# Patient Record
Sex: Male | Born: 1960
Health system: Southern US, Community
[De-identification: ages and names within clinical notes are randomized; demographics above are authoritative.]

## PROBLEM LIST (undated history)

## (undated) DIAGNOSIS — H409 Unspecified glaucoma: Secondary | ICD-10-CM

## (undated) DIAGNOSIS — I10 Essential (primary) hypertension: Secondary | ICD-10-CM

## (undated) DIAGNOSIS — Z860100 Personal history of colon polyps, unspecified: Secondary | ICD-10-CM

## (undated) DIAGNOSIS — L209 Atopic dermatitis, unspecified: Secondary | ICD-10-CM

## (undated) DIAGNOSIS — E7800 Pure hypercholesterolemia, unspecified: Secondary | ICD-10-CM

## (undated) DIAGNOSIS — F32A Depression, unspecified: Secondary | ICD-10-CM

## (undated) DIAGNOSIS — R7301 Impaired fasting glucose: Secondary | ICD-10-CM

## (undated) HISTORY — DX: Essential (primary) hypertension: I10

## (undated) HISTORY — DX: Atopic dermatitis, unspecified: L20.9

## (undated) HISTORY — DX: Personal history of colon polyps, unspecified: Z86.0100

## (undated) HISTORY — DX: Pure hypercholesterolemia, unspecified: E78.00

## (undated) HISTORY — DX: Depression, unspecified: F32.A

## (undated) HISTORY — DX: Impaired fasting glucose: R73.01

## (undated) HISTORY — DX: Unspecified glaucoma: H40.9

---
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
## 2006-05-02 ENCOUNTER — Inpatient Hospital Stay (HOSPITAL_COMMUNITY): Admission: EM | Admit: 2006-05-02 | Discharge: 2006-05-06 | Payer: Self-pay | Admitting: Emergency Medicine
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
## 2017-05-22 DIAGNOSIS — L309 Dermatitis, unspecified: Secondary | ICD-10-CM | POA: Diagnosis not present

## 2017-07-03 DIAGNOSIS — L309 Dermatitis, unspecified: Secondary | ICD-10-CM | POA: Diagnosis not present

## 2017-07-03 DIAGNOSIS — D2339 Other benign neoplasm of skin of other parts of face: Secondary | ICD-10-CM | POA: Diagnosis not present

## 2017-10-31 DIAGNOSIS — Z0000 Encounter for general adult medical examination without abnormal findings: Secondary | ICD-10-CM | POA: Diagnosis not present

## 2017-10-31 DIAGNOSIS — Z131 Encounter for screening for diabetes mellitus: Secondary | ICD-10-CM | POA: Diagnosis not present

## 2017-10-31 DIAGNOSIS — Z125 Encounter for screening for malignant neoplasm of prostate: Secondary | ICD-10-CM | POA: Diagnosis not present

## 2017-10-31 DIAGNOSIS — Z13220 Encounter for screening for lipoid disorders: Secondary | ICD-10-CM | POA: Diagnosis not present

## 2017-11-09 DIAGNOSIS — F4323 Adjustment disorder with mixed anxiety and depressed mood: Secondary | ICD-10-CM | POA: Diagnosis not present

## 2017-11-28 DIAGNOSIS — F4323 Adjustment disorder with mixed anxiety and depressed mood: Secondary | ICD-10-CM | POA: Diagnosis not present

## 2017-12-07 DIAGNOSIS — F4323 Adjustment disorder with mixed anxiety and depressed mood: Secondary | ICD-10-CM | POA: Diagnosis not present

## 2017-12-17 DIAGNOSIS — F4323 Adjustment disorder with mixed anxiety and depressed mood: Secondary | ICD-10-CM | POA: Diagnosis not present

## 2018-01-14 DIAGNOSIS — F4323 Adjustment disorder with mixed anxiety and depressed mood: Secondary | ICD-10-CM | POA: Diagnosis not present

## 2018-05-20 DIAGNOSIS — F4321 Adjustment disorder with depressed mood: Secondary | ICD-10-CM | POA: Diagnosis not present

## 2018-05-31 DIAGNOSIS — F4321 Adjustment disorder with depressed mood: Secondary | ICD-10-CM | POA: Diagnosis not present

## 2019-01-21 DIAGNOSIS — F411 Generalized anxiety disorder: Secondary | ICD-10-CM | POA: Diagnosis not present

## 2019-01-28 DIAGNOSIS — F411 Generalized anxiety disorder: Secondary | ICD-10-CM | POA: Diagnosis not present

## 2019-02-04 DIAGNOSIS — F411 Generalized anxiety disorder: Secondary | ICD-10-CM | POA: Diagnosis not present

## 2019-02-11 DIAGNOSIS — F411 Generalized anxiety disorder: Secondary | ICD-10-CM | POA: Diagnosis not present

## 2019-03-11 DIAGNOSIS — F411 Generalized anxiety disorder: Secondary | ICD-10-CM | POA: Diagnosis not present

## 2019-03-18 DIAGNOSIS — F411 Generalized anxiety disorder: Secondary | ICD-10-CM | POA: Diagnosis not present

## 2019-03-21 DIAGNOSIS — F17200 Nicotine dependence, unspecified, uncomplicated: Secondary | ICD-10-CM | POA: Diagnosis not present

## 2019-03-21 DIAGNOSIS — Z1211 Encounter for screening for malignant neoplasm of colon: Secondary | ICD-10-CM | POA: Diagnosis not present

## 2019-04-01 DIAGNOSIS — F411 Generalized anxiety disorder: Secondary | ICD-10-CM | POA: Diagnosis not present

## 2019-04-08 DIAGNOSIS — F411 Generalized anxiety disorder: Secondary | ICD-10-CM | POA: Diagnosis not present

## 2019-04-15 DIAGNOSIS — F411 Generalized anxiety disorder: Secondary | ICD-10-CM | POA: Diagnosis not present

## 2019-04-21 DIAGNOSIS — Z125 Encounter for screening for malignant neoplasm of prostate: Secondary | ICD-10-CM | POA: Diagnosis not present

## 2019-04-21 DIAGNOSIS — Z13220 Encounter for screening for lipoid disorders: Secondary | ICD-10-CM | POA: Diagnosis not present

## 2019-04-21 DIAGNOSIS — Z0000 Encounter for general adult medical examination without abnormal findings: Secondary | ICD-10-CM | POA: Diagnosis not present

## 2019-04-22 DIAGNOSIS — F411 Generalized anxiety disorder: Secondary | ICD-10-CM | POA: Diagnosis not present

## 2019-04-29 DIAGNOSIS — F411 Generalized anxiety disorder: Secondary | ICD-10-CM | POA: Diagnosis not present

## 2019-05-06 DIAGNOSIS — Z01818 Encounter for other preprocedural examination: Secondary | ICD-10-CM | POA: Diagnosis not present

## 2019-05-13 DIAGNOSIS — F411 Generalized anxiety disorder: Secondary | ICD-10-CM | POA: Diagnosis not present

## 2019-05-20 DIAGNOSIS — F411 Generalized anxiety disorder: Secondary | ICD-10-CM | POA: Diagnosis not present

## 2019-05-27 DIAGNOSIS — F411 Generalized anxiety disorder: Secondary | ICD-10-CM | POA: Diagnosis not present

## 2019-06-10 DIAGNOSIS — F411 Generalized anxiety disorder: Secondary | ICD-10-CM | POA: Diagnosis not present

## 2019-06-11 DIAGNOSIS — Z1159 Encounter for screening for other viral diseases: Secondary | ICD-10-CM | POA: Diagnosis not present

## 2019-06-16 DIAGNOSIS — D124 Benign neoplasm of descending colon: Secondary | ICD-10-CM | POA: Diagnosis not present

## 2019-06-16 DIAGNOSIS — D122 Benign neoplasm of ascending colon: Secondary | ICD-10-CM | POA: Diagnosis not present

## 2019-06-16 DIAGNOSIS — Z1211 Encounter for screening for malignant neoplasm of colon: Secondary | ICD-10-CM | POA: Diagnosis not present

## 2019-06-16 DIAGNOSIS — K648 Other hemorrhoids: Secondary | ICD-10-CM | POA: Diagnosis not present

## 2019-06-16 DIAGNOSIS — K635 Polyp of colon: Secondary | ICD-10-CM | POA: Diagnosis not present

## 2019-06-24 DIAGNOSIS — F411 Generalized anxiety disorder: Secondary | ICD-10-CM | POA: Diagnosis not present

## 2019-07-08 DIAGNOSIS — F411 Generalized anxiety disorder: Secondary | ICD-10-CM | POA: Diagnosis not present

## 2019-07-15 ENCOUNTER — Other Ambulatory Visit: Payer: Self-pay

## 2019-07-15 DIAGNOSIS — Z20822 Contact with and (suspected) exposure to covid-19: Secondary | ICD-10-CM

## 2019-07-17 LAB — NOVEL CORONAVIRUS, NAA: SARS-CoV-2, NAA: DETECTED — AB

## 2019-07-26 ENCOUNTER — Other Ambulatory Visit: Payer: Self-pay

## 2019-07-26 ENCOUNTER — Encounter (HOSPITAL_COMMUNITY): Payer: Self-pay | Admitting: Emergency Medicine

## 2019-07-26 ENCOUNTER — Emergency Department (HOSPITAL_COMMUNITY)
Admission: EM | Admit: 2019-07-26 | Discharge: 2019-07-26 | Disposition: A | Discharge status: Home / Self Care | Payer: BC Managed Care – PPO | Attending: Emergency Medicine | Admitting: Emergency Medicine

## 2019-07-26 ENCOUNTER — Emergency Department (HOSPITAL_COMMUNITY): Payer: BC Managed Care – PPO

## 2019-07-26 DIAGNOSIS — R059 Cough, unspecified: Secondary | ICD-10-CM

## 2019-07-26 DIAGNOSIS — U071 COVID-19: Secondary | ICD-10-CM | POA: Insufficient documentation

## 2019-07-26 DIAGNOSIS — R05 Cough: Secondary | ICD-10-CM

## 2019-07-26 DIAGNOSIS — R509 Fever, unspecified: Secondary | ICD-10-CM | POA: Insufficient documentation

## 2019-07-26 DIAGNOSIS — F17228 Nicotine dependence, chewing tobacco, with other nicotine-induced disorders: Secondary | ICD-10-CM | POA: Insufficient documentation

## 2019-07-26 DIAGNOSIS — R0602 Shortness of breath: Secondary | ICD-10-CM | POA: Diagnosis not present

## 2019-07-26 LAB — CBC WITH DIFFERENTIAL/PLATELET
Abs Immature Granulocytes: 0.02 10*3/uL (ref 0.00–0.07)
Basophils Absolute: 0 10*3/uL (ref 0.0–0.1)
Basophils Relative: 0 %
Eosinophils Absolute: 0 10*3/uL (ref 0.0–0.5)
Eosinophils Relative: 0 %
HCT: 42.7 % (ref 39.0–52.0)
Hemoglobin: 14.2 g/dL (ref 13.0–17.0)
Immature Granulocytes: 0 %
Lymphocytes Relative: 9 %
Lymphs Abs: 0.7 10*3/uL (ref 0.7–4.0)
MCH: 30.3 pg (ref 26.0–34.0)
MCHC: 33.3 g/dL (ref 30.0–36.0)
MCV: 91 fL (ref 80.0–100.0)
Monocytes Absolute: 0.9 10*3/uL (ref 0.1–1.0)
Monocytes Relative: 10 %
Neutro Abs: 6.6 10*3/uL (ref 1.7–7.7)
Neutrophils Relative %: 81 %
Platelets: 274 10*3/uL (ref 150–400)
RBC: 4.69 MIL/uL (ref 4.22–5.81)
RDW: 11.9 % (ref 11.5–15.5)
WBC: 8.2 10*3/uL (ref 4.0–10.5)
nRBC: 0 % (ref 0.0–0.2)

## 2019-07-26 LAB — COMPREHENSIVE METABOLIC PANEL
ALT: 42 U/L (ref 0–44)
AST: 53 U/L — ABNORMAL HIGH (ref 15–41)
Albumin: 3.5 g/dL (ref 3.5–5.0)
Alkaline Phosphatase: 52 U/L (ref 38–126)
Anion gap: 14 (ref 5–15)
BUN: 33 mg/dL — ABNORMAL HIGH (ref 6–20)
CO2: 20 mmol/L — ABNORMAL LOW (ref 22–32)
Calcium: 8.5 mg/dL — ABNORMAL LOW (ref 8.9–10.3)
Chloride: 103 mmol/L (ref 98–111)
Creatinine, Ser: 1.53 mg/dL — ABNORMAL HIGH (ref 0.61–1.24)
GFR calc Af Amer: 56 mL/min — ABNORMAL LOW (ref >60)
GFR calc non Af Amer: 49 mL/min — ABNORMAL LOW (ref >60)
Glucose, Bld: 134 mg/dL — ABNORMAL HIGH (ref 70–99)
Potassium: 3.6 mmol/L (ref 3.5–5.1)
Sodium: 137 mmol/L (ref 135–145)
Total Bilirubin: 1.2 mg/dL (ref 0.3–1.2)
Total Protein: 7.9 g/dL (ref 6.5–8.1)

## 2019-07-26 LAB — FIBRINOGEN: Fibrinogen: >800 mg/dL — ABNORMAL HIGH (ref 210–475)

## 2019-07-26 LAB — POC SARS CORONAVIRUS 2 AG -  ED: SARS Coronavirus 2 Ag: NEGATIVE

## 2019-07-26 LAB — C-REACTIVE PROTEIN: CRP: 18.2 mg/dL — ABNORMAL HIGH (ref <1.0)

## 2019-07-26 LAB — LACTATE DEHYDROGENASE: LDH: 466 U/L — ABNORMAL HIGH (ref 98–192)

## 2019-07-26 LAB — LACTIC ACID, PLASMA
Lactic Acid, Venous: 1.7 mmol/L (ref 0.5–1.9)
Lactic Acid, Venous: 1.3 mmol/L (ref 0.5–1.9)

## 2019-07-26 LAB — FERRITIN: Ferritin: 1261 ng/mL — ABNORMAL HIGH (ref 24–336)

## 2019-07-26 LAB — PROCALCITONIN: Procalcitonin: 0.33 ng/mL

## 2019-07-26 LAB — D-DIMER, QUANTITATIVE: D-Dimer, Quant: 2.11 ug/mL-FEU — ABNORMAL HIGH (ref 0.00–0.50)

## 2019-07-26 LAB — TRIGLYCERIDES: Triglycerides: 96 mg/dL (ref <150)

## 2019-07-26 IMAGING — DX DG CHEST 1V PORT
1 series · 1 of 1 positions shown · non-contrast
Comparison: [DATE]

CLINICAL DATA: Shortness of breath, cough

EXAM:
PORTABLE CHEST 1 VIEW

[chest ap]
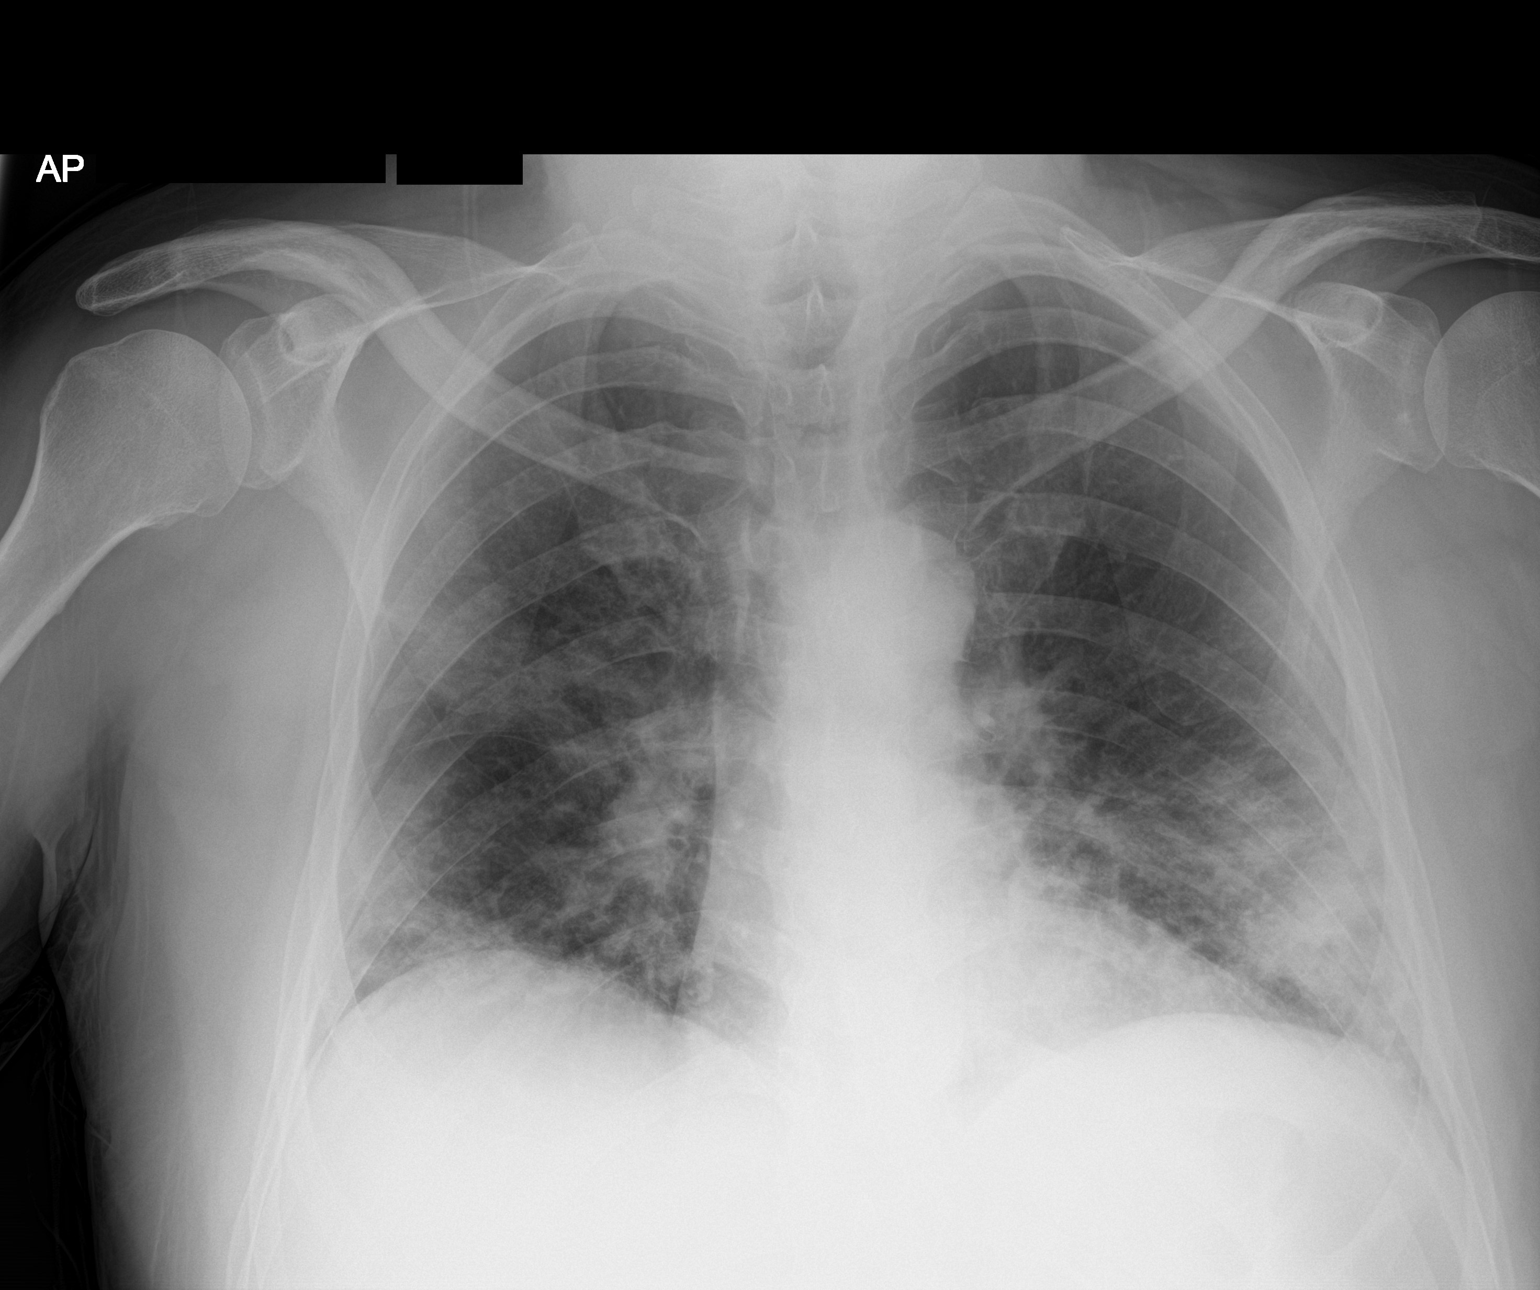

[1 of 1 positions shown; findings below may reference images not displayed]

FINDINGS: Patchy bilateral airspace opacities noted, predominantly
peripherally. Heart is normal size. No effusions. No acute bony
abnormality.
IMPRESSION: Patchy bilateral airspace disease most compatible with multifocal
pneumonia. Appearance would be compatible with COVID pneumonia.

## 2019-07-26 MED ORDER — BENZONATATE 100 MG PO CAPS: 100.0000 mg | ORAL_CAPSULE | Freq: Three times a day (TID) | ORAL | 0 refills | Status: DC

## 2019-07-26 MED ORDER — ACETAMINOPHEN 500 MG PO TABS
1000.0000 mg | ORAL_TABLET | Freq: Once | ORAL | Status: AC
  Administered 2019-07-26: 1000 mg via ORAL
  Filled 2019-07-26: qty 2

## 2019-07-26 NOTE — ED Notes (Signed)
Pt was verbalized discharge instructions. Pt had no further questions at this time. NAD. 

## 2019-07-26 NOTE — Discharge Instructions (Signed)
I am sending you home with some cough medication. Take as prescribed. Continue to take Tylenol and ibuprofen as needed for fevers. Follow-up with your PCP if your symptoms do not improve within the next week. Continue to self-quarantine away from others until you are symptom free. Return to the ER for new or worsening symptoms.

## 2019-07-26 NOTE — ED Triage Notes (Signed)
Patient reports he has had Covid 19 for 2 weeks and symptoms have worsened. He describes having a fever night sweats, coughing and low PO intake. Patient is very concerned about his condition with his age.

## 2019-07-26 NOTE — ED Provider Notes (Signed)
[facility]-EMERGENCY DEPT Provider Note   CSN: [id number] Arrival date & time: 07/26/19  1344     History   Chief Complaint Chief Complaint  Patient presents with  . Fever  . Cough  . Night Sweats    HPI Max Walsh is a 58 y.o. male with no significant past medical history who presents to the ED due to gradual onset of worsening productive cough and shortness of breath. Patient tested positive for COVID-19 on 11/17. Patient notes his symptoms started a few days prior. He notes he has had an intermittent fever since diagnosis. T-max 102.8. He has tried tylenol, Nyquil, and Dayquil with moderate relief. Patient notes shortness of breath is only with exertion. Denies history of asthma. Cough is associated with yellow sputum. Patient denies chest pain, abdominal pain, nausea, vomiting, and diarrhea.   History reviewed. No pertinent past medical history.  There are no active problems to display for this patient.   History reviewed. No pertinent surgical history.      Home Medications    Prior to Admission medications   Medication Sig Start Date End Date Taking? Authorizing Provider  acetaminophen (TYLENOL) 325 MG tablet Take 650 mg by mouth every 6 (six) hours as needed for fever.   Yes [provider]  benzonatate (TESSALON) 100 MG capsule Take 1 capsule (100 mg total) by mouth every 8 (eight) hours. 07/26/19   Cheek, Vesta Mixer, PA-C  buPROPion (WELLBUTRIN XL) 150 MG 24 hr tablet Take 150 mg by mouth every morning. 06/16/19   [provider]    Family History History reviewed. No pertinent family history.  Social History Social History   Tobacco Use  . Smoking status: Never Smoker  . Smokeless tobacco: Current User    Types: Snuff  Substance Use Topics  . Alcohol use: Not on file  . Drug use: Not on file     Allergies   Patient has no known allergies.   Review of Systems Review of Systems  Constitutional: Positive  for appetite change, chills, diaphoresis and fever.  Respiratory: Positive for cough and shortness of breath.   Cardiovascular: Negative for chest pain and leg swelling.  Gastrointestinal: Negative for abdominal pain, diarrhea, nausea and vomiting.  Musculoskeletal: Negative for neck stiffness.     Physical Exam Updated Vital Signs BP 138/80   Pulse (!) 116   Temp (!) 102.4 F (39.1 C) (Oral)   Resp (!) 30   Ht 6' (1.829 m)   Wt 79.8 kg   SpO2 91%   BMI 23.87 kg/m   Physical Exam Vitals signs and nursing note reviewed.  Constitutional:      General: He is not in acute distress. HENT:     Head: Normocephalic.     Nose: Nose normal.     Mouth/Throat:     Mouth: Mucous membranes are moist.     Pharynx: No oropharyngeal exudate or posterior oropharyngeal erythema.  Eyes:     Conjunctiva/sclera: Conjunctivae normal.  Neck:     Musculoskeletal: Neck supple.     Comments: No meningismus Cardiovascular:     Rate and Rhythm: Regular rhythm. Tachycardia present.     Pulses: Normal pulses.     Heart sounds: Normal heart sounds. No murmur. No friction rub. No gallop.   Pulmonary:     Effort: Pulmonary effort is normal.     Breath sounds: Normal breath sounds.     Comments: Coughing on exam. Respirations equal and unlabored, patient able to  speak in full sentences, lungs clear to auscultation bilaterally. No accessory muscle usage.  Abdominal:     General: Abdomen is flat. There is no distension.     Palpations: Abdomen is soft.     Tenderness: There is no abdominal tenderness. There is no guarding or rebound.  Musculoskeletal:     Right lower leg: No edema.     Left lower leg: No edema.     Comments: Able to move all 4 extremities without difficulty. Distal sensation and pulses intact bilaterally. No lower extremity edema. Negative Homans sign bilaterally.   Skin:    General: Skin is warm and dry.  Neurological:     General: No focal deficit present.     Mental Status: He  is alert.      ED Treatments / Results  Labs (all labs ordered are listed, but only abnormal results are displayed) Labs Reviewed  COMPREHENSIVE METABOLIC PANEL - Abnormal; Notable for the following components:      Result Value   CO2 20 (*)    Glucose, Bld 134 (*)    BUN 33 (*)    Creatinine, Ser 1.53 (*)    Calcium 8.5 (*)    AST 53 (*)    GFR calc non Af Amer 49 (*)    GFR calc Af Amer 56 (*)    All other components within normal limits  D-DIMER, QUANTITATIVE (NOT AT [facility]) - Abnormal; Notable for the following components:   D-Dimer, Quant 2.11 (*)    All other components within normal limits  LACTATE DEHYDROGENASE - Abnormal; Notable for the following components:   LDH 466 (*)    All other components within normal limits  FERRITIN - Abnormal; Notable for the following components:   Ferritin 1,261 (*)    All other components within normal limits  FIBRINOGEN - Abnormal; Notable for the following components:   Fibrinogen >800 (*)    All other components within normal limits  C-REACTIVE PROTEIN - Abnormal; Notable for the following components:   CRP 18.2 (*)    All other components within normal limits  CULTURE, BLOOD (ROUTINE X 2)  CULTURE, BLOOD (ROUTINE X 2)  LACTIC ACID, PLASMA  LACTIC ACID, PLASMA  CBC WITH DIFFERENTIAL/PLATELET  PROCALCITONIN  TRIGLYCERIDES  POC SARS CORONAVIRUS 2 AG -  ED    EKG EKG Interpretation  Date/Time:  Saturday July 26 2019 19:34:32 EST Ventricular Rate:  104 PR Interval:    QRS Duration: 92 QT Interval:  309 QTC Calculation: 407 R Axis:   62 Text Interpretation: Sinus tachycardia Probable left atrial enlargement Confirmed by Loren RacerYelverton, David ([id number]) on 07/26/2019 9:41:15 PM   Radiology Dg Chest Port 1 View  Result Date: 07/26/2019 CLINICAL DATA:  Shortness of breath, cough EXAM: PORTABLE CHEST 1 VIEW COMPARISON:  05/01/2006 FINDINGS: Patchy bilateral airspace opacities noted, predominantly peripherally. Heart is normal  size. No effusions. No acute bony abnormality. IMPRESSION: Patchy bilateral airspace disease most compatible with multifocal pneumonia. Appearance would be compatible with COVID pneumonia. Electronically Signed   By: Charlett NoseKevin  Dover M.D.   On: 07/26/2019 19:04    Procedures Procedures (including critical care time)  Medications Ordered in ED Medications  acetaminophen (TYLENOL) tablet 1,000 mg (1,000 mg Oral Given 07/26/19 1919)     Initial Impression / Assessment and Plan / ED Course  I have reviewed the triage vital signs and the nursing notes.  Pertinent labs & imaging results that were available during my care of the patient were reviewed by  me and considered in my medical decision making (see chart for details).       58 year old male presents to the ED due to worsening COVID-19 symptoms. Patient tested positive for COVID on 11/19. Upon arrival, patient is tachycardic at 116 and febrile at 102.4. O2 saturations on ambulation maintained at 91%; however, while speaking to patient O2 saturations dropped down to 87%. Given patient's hypoxia, will order [place] admission labs.   CXR personally reviewed which demonstrates bilateral patchy airspace disease typical of COVID multifocal pneumonia. CBC unremarkable with no leukocytosis. CMP positive for AKI with creatinine at 1.53 and BUN at 33. Elevated AST at 53. Lactic acid normal at 1.3. Inflammatory markers are all elevated as expect with COVID-19 infection. EKG personally reviewed which demonstrated sinus tachycardia with no signs of ischemia.  9:40 PM re-assessed patient. Patient is resting comfortably. He notes his symptoms have drastically improved with Tylenol. Patient ambulated and maintained O2 saturation above 95%. Tachycardia has resolved. Patient mildly tachypneic, but speaking in full sentences and without accessory muscle usage. Shared decision making with patient. Patient feels comfortable going home and being treated symptomatically.  Will discharge patient with Tessalon Perles for cough. Advised patient to continue to drinking lots of fluids. I have instructed patient to follow-up with PCP if symptoms do not improve within the next week. Strict ED precautions discussed with patient. Patient states understanding and agrees to plan. Patient discharged home in no acute distress and stable vitals   Discussed patient with Dr. Lita Mains who agrees with assessment and plan.   Benji Poynter was evaluated in Emergency Department on 07/27/2019 for the symptoms described in the history of present illness. He was evaluated in the context of the global COVID-19 pandemic, which necessitated consideration that the patient might be at risk for infection with the SARS-CoV-2 virus that causes COVID-19. Institutional protocols and algorithms that pertain to the evaluation of patients at risk for COVID-19 are in a state of rapid change based on information released by regulatory bodies including the CDC and federal and state organizations. These policies and algorithms were followed during the patient's care in the ED.  Final Clinical Impressions(s) / ED Diagnoses   Final diagnoses:  COVID-19  Cough    ED Discharge Orders         Ordered    benzonatate (TESSALON) 100 MG capsule  Every 8 hours     07/26/19 2142           Jonette Eva, PA-C 07/27/19 0001    Julianne Rice, MD 08/09/19 1246

## 2019-07-26 NOTE — ED Notes (Signed)
Patients SPO2 remained 95% RA while ambulating in room. Will continue to monitor.

## 2019-07-28 ENCOUNTER — Telehealth (HOSPITAL_BASED_OUTPATIENT_CLINIC_OR_DEPARTMENT_OTHER): Payer: Self-pay | Admitting: *Deleted

## 2019-07-29 LAB — CULTURE, BLOOD (ROUTINE X 2): Special Requests: ADEQUATE

## 2019-07-30 ENCOUNTER — Telehealth: Payer: Self-pay

## 2019-07-30 NOTE — Progress Notes (Signed)
ED Antimicrobial Stewardship Positive Culture Follow Up   Max Walsh is an 58 y.o. male recently dx with COVID presented to [facility] on 07/26/2019 with a chief complaint of SOB, fever and cough.  Chief Complaint  Patient presents with  . Fever  . Cough  . Night Sweats    Recent Results (from the past 720 hour(s))  Novel Coronavirus, NAA ([facility])     Status: Abnormal   Collection Time: 07/15/19 12:00 AM   Specimen: Nasopharyngeal(NP) swabs in vial transport medium   NASOPHARYNGE  TESTING  Result Value Ref Range Status   SARS-CoV-2, NAA Detected (A) Not Detected Final    Comment: This nucleic acid amplification test was developed and its performance characteristics determined by [employer]. Nucleic acid amplification tests include PCR and TMA. This test has not been FDA cleared or approved. This test has been authorized by FDA under an Emergency Use Authorization (EUA). This test is only authorized for the duration of time the declaration that circumstances exist justifying the authorization of the emergency use of in vitro diagnostic tests for detection of SARS-CoV-2 virus and/or diagnosis of COVID-19 infection under section 564(b)(1) of the Act, 21 U.S.C. 762GBT-5(V) (1), unless the authorization is terminated or revoked sooner. When diagnostic testing is negative, the possibility of a false negative result should be considered in the context of a patient's recent exposures and the presence of clinical signs and symptoms consistent with COVID-19. An individual without symptoms of COVID-19 and who is not shedding SARS-CoV-2 virus would  expect to have a negative (not detected) result in this assay.   Blood Culture (routine x 2)     Status: Abnormal   Collection Time: 07/26/19  8:20 PM   Specimen: BLOOD LEFT HAND  Result Value Ref Range Status   Specimen Description   Final    BLOOD LEFT HAND Performed at [facility] [address] [place], [place] [address]    Special Requests   Final    BOTTLES DRAWN AEROBIC AND ANAEROBIC Blood Culture adequate volume Performed at [facility] [address], [place], [place] [address]    Culture  Setup Time   Final    GRAM POSITIVE COCCI ANAEROBIC BOTTLE ONLY CRITICAL RESULT CALLED TO, READ BACK BY AND VERIFIED WITH: CRN S MAYNERD @0028  07/28/19 BY S GEZAHEGN     Culture (A)  Final    STAPHYLOCOCCUS SPECIES (COAGULASE NEGATIVE) THE SIGNIFICANCE OF ISOLATING THIS ORGANISM FROM A SINGLE SET OF BLOOD CULTURES WHEN MULTIPLE SETS ARE DRAWN IS UNCERTAIN. PLEASE NOTIFY THE MICROBIOLOGY DEPARTMENT WITHIN ONE WEEK IF SPECIATION AND SENSITIVITIES ARE REQUIRED. Performed at [facility], [place] [address] [place], [place] [address]    Report Status 07/29/2019 FINAL  Final  Blood Culture (routine x 2)     Status: None (Preliminary result)   Collection Time: 07/26/19  8:20 PM   Specimen: BLOOD  Result Value Ref Range Status   Specimen Description   Final    BLOOD RIGHT ANTECUBITAL Performed at [facility] [address], [place], [place] [address]    Special Requests   Final    BOTTLES DRAWN AEROBIC ONLY Blood Culture adequate volume Performed at [facility] [address] [place], [place] [address]    Culture   Final    NO GROWTH 3 DAYS Performed at [facility], [place] [address] [place], [place] [address]    Report Status PENDING  Incomplete    -  Suspects CoNS in 1 set of bcx is contaminant - no txment is needed at this time  ED Provider: Ebbie Ridge, PA-C   Jagger Demonte P 07/30/2019, 1:20 PM Clinical Pharmacist [phone]

## 2019-07-30 NOTE — Telephone Encounter (Signed)
No treatment for [facility] ED 07/26/2019 per Irena Cords [facility]

## 2019-08-01 LAB — CULTURE, BLOOD (ROUTINE X 2)
Culture: NO GROWTH
Special Requests: ADEQUATE

## 2019-08-04 DIAGNOSIS — U071 COVID-19: Secondary | ICD-10-CM | POA: Diagnosis not present

## 2019-12-04 ENCOUNTER — Ambulatory Visit: Payer: Self-pay | Attending: Internal Medicine

## 2019-12-04 DIAGNOSIS — Z23 Encounter for immunization: Secondary | ICD-10-CM

## 2019-12-04 NOTE — Progress Notes (Signed)
   Covid-19 Vaccination Clinic  Name:  Max Walsh    MRN: [id number] DOB: 01-21-1961  12/04/2019  Max Walsh was observed post Covid-19 immunization for 15 minutes without incident. He was provided with Vaccine Information Sheet and instruction to access the V-Safe system.   Max Walsh was instructed to call 911 with any severe reactions post vaccine: Marland Kitchen Difficulty breathing  . Swelling of face and throat  . A fast heartbeat  . A bad rash all over body  . Dizziness and weakness   Immunizations Administered    Name Date Dose VIS Date Route   Pfizer COVID-19 Vaccine 12/04/2019  3:54 PM 0.3 mL 08/08/2019 Intramuscular   Manufacturer: [employer], [employer]   Lot: [id number]   NDC: [address]-3

## 2019-12-29 ENCOUNTER — Ambulatory Visit: Payer: Self-pay | Attending: Internal Medicine

## 2019-12-29 DIAGNOSIS — Z23 Encounter for immunization: Secondary | ICD-10-CM

## 2019-12-29 NOTE — Progress Notes (Signed)
   Covid-19 Vaccination Clinic  Name:  Max Walsh    MRN: [id number] DOB: Jan 04, 1961  12/29/2019  Max Walsh was observed post Covid-19 immunization for 15 minutes without incident. He was provided with Vaccine Information Sheet and instruction to access the V-Safe system.   Max Walsh was instructed to call 911 with any severe reactions post vaccine: Marland Kitchen Difficulty breathing  . Swelling of face and throat  . A fast heartbeat  . A bad rash all over body  . Dizziness and weakness   Immunizations Administered    Name Date Dose VIS Date Route   Pfizer COVID-19 Vaccine 12/29/2019  9:20 AM 0.3 mL 10/22/2018 Intramuscular   Manufacturer: [employer], [employer]   Lot: [id number]   NDC: [address]-9

## 2021-10-27 ENCOUNTER — Other Ambulatory Visit (HOSPITAL_COMMUNITY): Payer: Self-pay | Admitting: Optometry

## 2021-10-27 ENCOUNTER — Other Ambulatory Visit: Payer: Self-pay | Admitting: Optometry

## 2021-10-27 DIAGNOSIS — H40013 Open angle with borderline findings, low risk, bilateral: Secondary | ICD-10-CM

## 2021-11-14 ENCOUNTER — Other Ambulatory Visit: Payer: Self-pay

## 2021-11-14 ENCOUNTER — Encounter (HOSPITAL_COMMUNITY): Payer: Self-pay

## 2021-11-14 ENCOUNTER — Ambulatory Visit (HOSPITAL_COMMUNITY)
Admission: RE | Admit: 2021-11-14 | Discharge: 2021-11-14 | Disposition: A | Discharge status: Home / Self Care | Payer: 59 | Source: Ambulatory Visit | Attending: Optometry | Admitting: Optometry

## 2021-11-14 DIAGNOSIS — H40013 Open angle with borderline findings, low risk, bilateral: Secondary | ICD-10-CM

## 2021-11-14 IMAGING — MR MR ORBITS WO/W CM
4 of 6 series · 19 of 48 positions shown · IV contrast (gadavist)
Comparison: None.

CLINICAL DATA: Open-angle glaucoma

EXAM:
MRI HEAD AND ORBITS WITHOUT AND WITH CONTRAST
TECHNIQUE: Multiplanar, multiecho pulse sequences of the brain and surrounding
structures were obtained without and with intravenous contrast.
Multiplanar, multiecho pulse sequences of the orbits and surrounding
structures were obtained including fat saturation techniques, before
and after intravenous contrast administration.
CONTRAST:  8mL GADAVIST GADOBUTROL 1 MMOL/ML IV SOLN

[Series 8: T1 · coronal · 3.0mm · 0.35mm/px · 3 of 36 slices shown (1 of 2)]
[im 4/36]
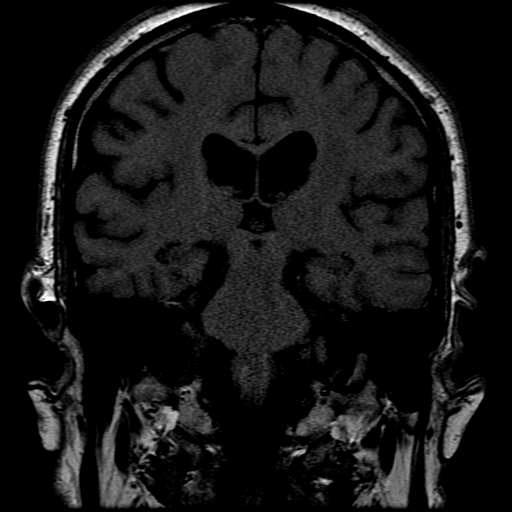
[im 20/36]
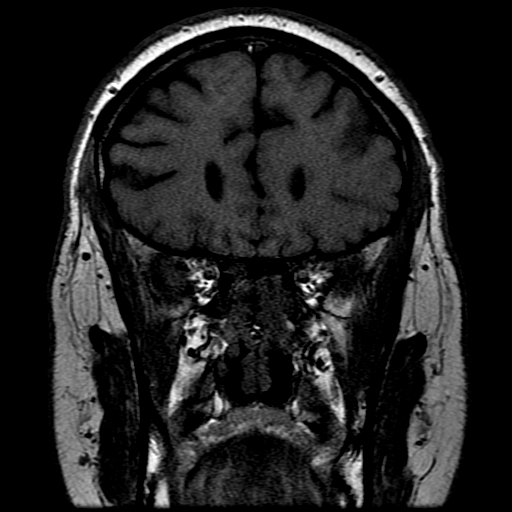
[im 32/36]
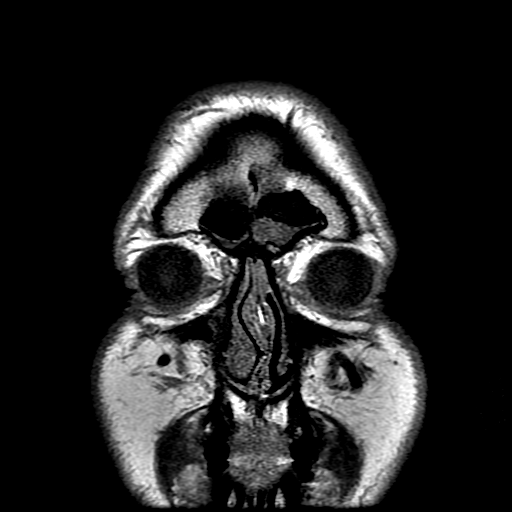

[Series 11: T2 fat-sat · coronal · 3.0mm · 0.35mm/px · 8 of 36 slices shown (1 of 2)]
[im 1/36]
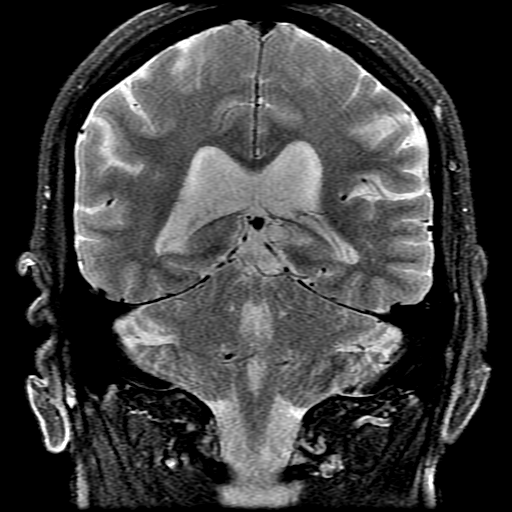
[im 4/36]
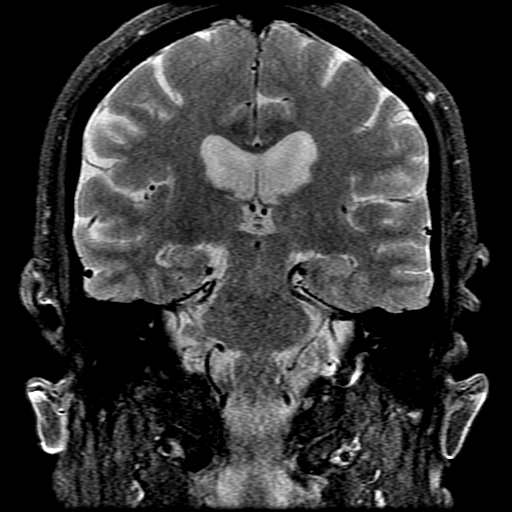
[im 12/36]
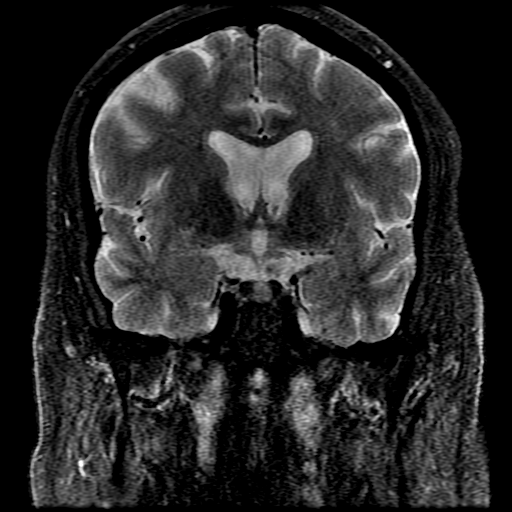
[im 16/36]
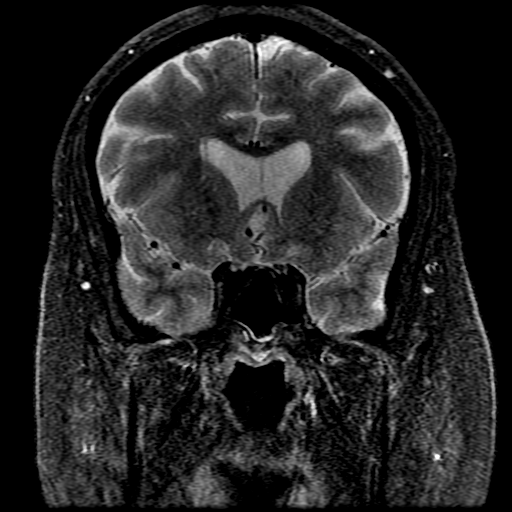
[im 20/36]
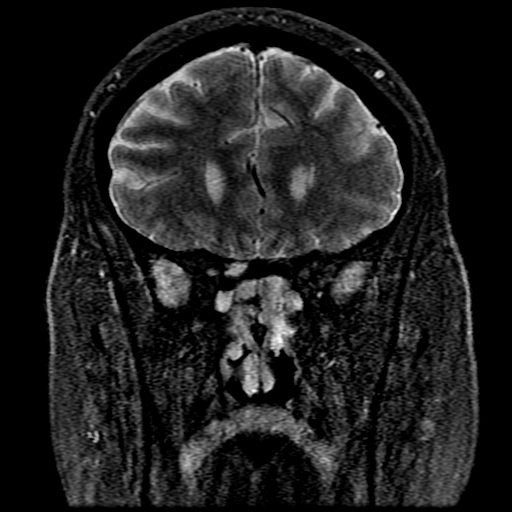
[im 24/36]
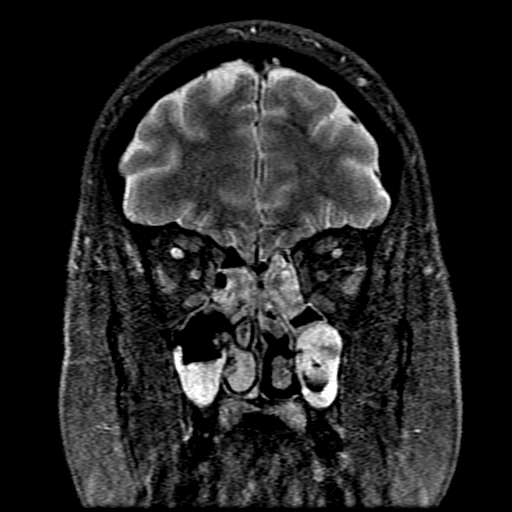
[im 32/36]
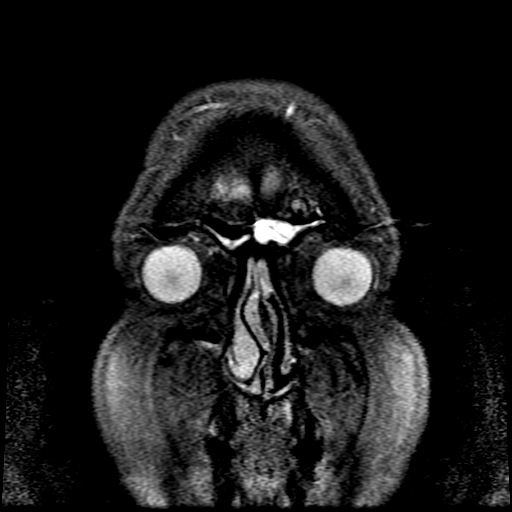
[im 36/36]
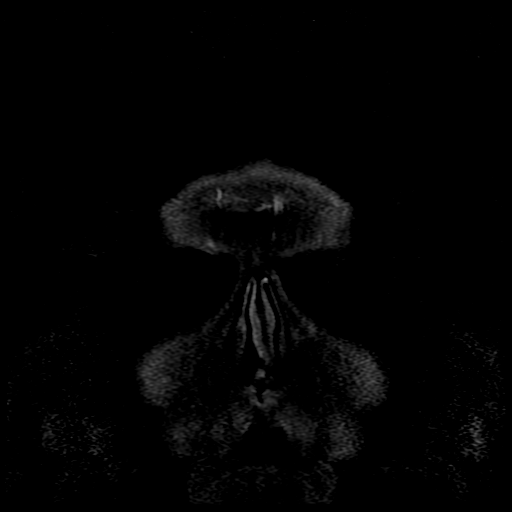

[Series 12: T1 · axial · 3.0mm · 0.35mm/px · z∈[+3,+55]mm · 3 of 20 slices shown (2 of 2)]
[im 4/20]
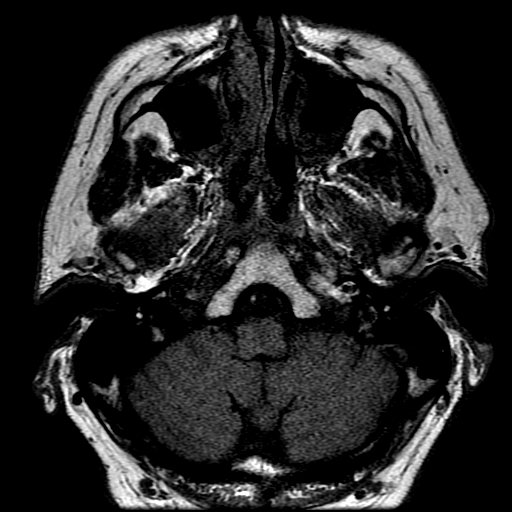
[im 12/20]
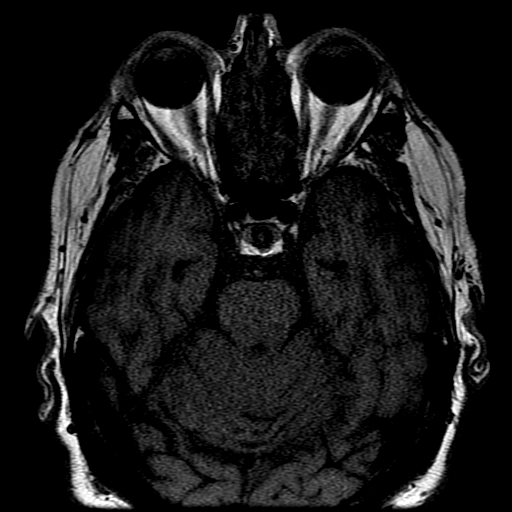
[im 20/20]
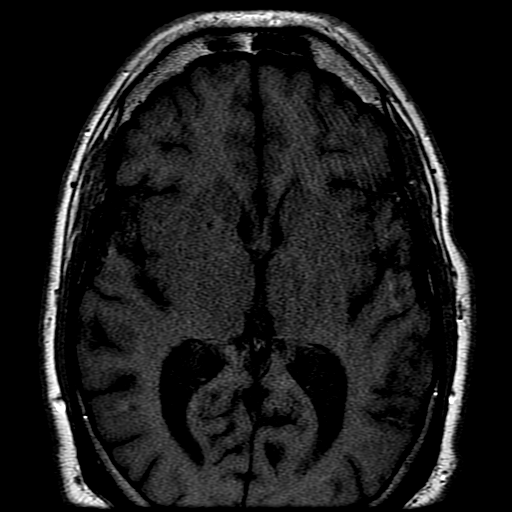

[Series 13: T2 fat-sat · axial · 3.0mm · 0.35mm/px · z∈[-7,+55]mm · 5 of 20 slices shown (2 of 2)]
[im 1/20]
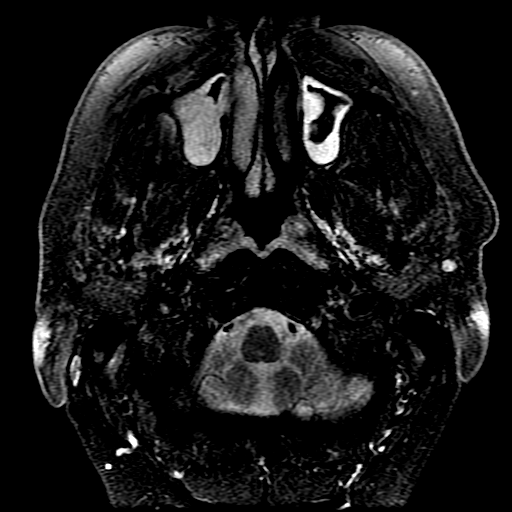
[im 4/20]
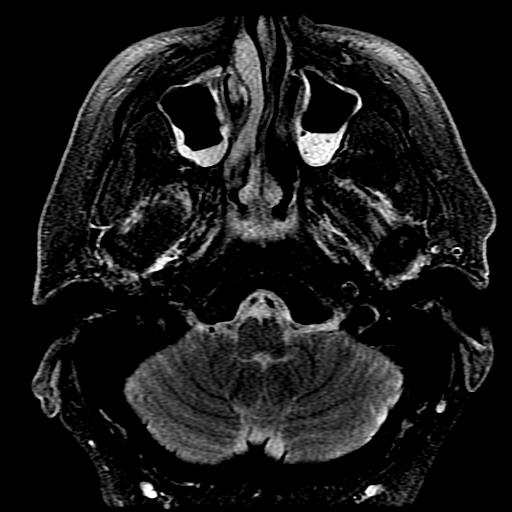
[im 8/20]
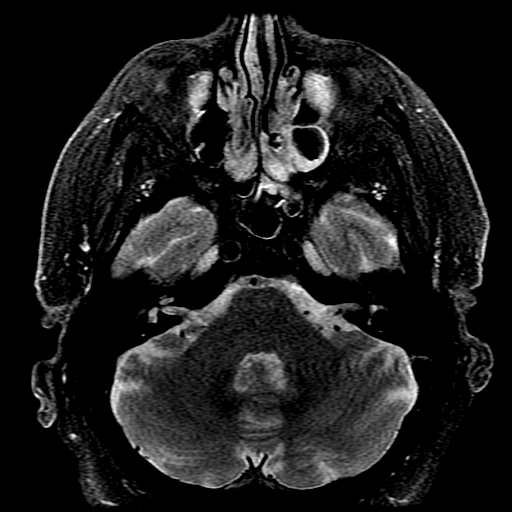
[im 12/20]
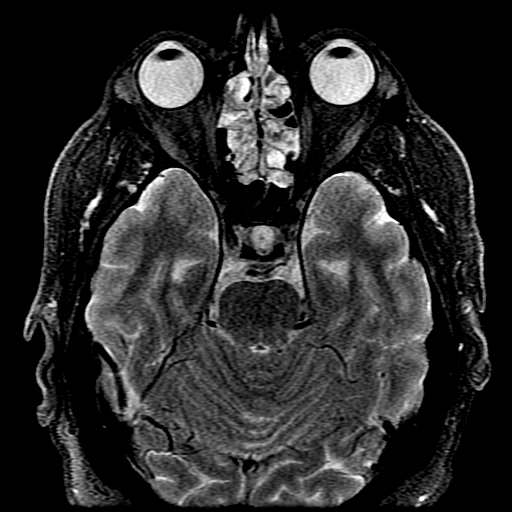
[im 20/20]
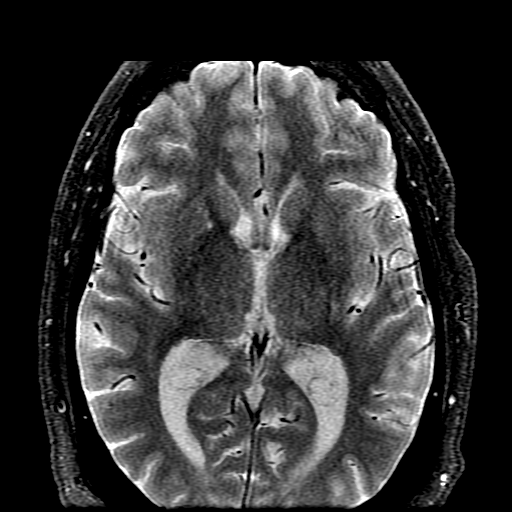

[19 of 48 positions shown; findings below may reference images not displayed]

FINDINGS: MRI HEAD FINDINGS

Brain: No acute infarction, hemorrhage, hydrocephalus, extra-axial
collection or mass lesion. Few small white matter hyperintensities
bilaterally.

Normal enhancement postcontrast administration.

Vascular: Normal arterial flow voids at the skull base.

Skull and upper cervical spine: No focal abnormality.

Other: None

MRI ORBITS FINDINGS

Orbits: Globes symmetric in size and configuration. Normal lens
location bilaterally. Optic nerve normal bilaterally. No optic nerve
mass or signal abnormality. Optic chiasm normal. Pituitary not
enlarged. Cavernous sinus normal bilaterally. Extraocular muscles
normal bilaterally. No orbital mass or edema. Normal enhancement of
the orbit.

Visualized sinuses: Mucosal edema throughout the paranasal sinuses.
Air-fluid levels in the maxillary sinus bilaterally. Mastoid clear
bilaterally.

Soft tissues: No soft tissue edema or mass.
IMPRESSION: 1. MRI head normal for age. Few small white matter hyperintensities
likely due to chronic microvascular ischemia.
2. Normal orbit bilaterally.  Optic nerve normal bilaterally.
3. Moderate mucosal edema throughout the paranasal sinuses.
Air-fluid level in the maxillary sinus bilaterally.

## 2021-11-14 IMAGING — MR MR HEAD WO/W CM
9 of 12 series · 34 of 48 positions shown · IV contrast (gadavist)
Comparison: None.

CLINICAL DATA: Open-angle glaucoma

EXAM:
MRI HEAD AND ORBITS WITHOUT AND WITH CONTRAST
TECHNIQUE: Multiplanar, multiecho pulse sequences of the brain and surrounding
structures were obtained without and with intravenous contrast.
Multiplanar, multiecho pulse sequences of the orbits and surrounding
structures were obtained including fat saturation techniques, before
and after intravenous contrast administration.
CONTRAST:  8mL GADAVIST GADOBUTROL 1 MMOL/ML IV SOLN

[Series 4: DWI · axial · 3.0mm · 1.09mm/px · z∈[-14,+134]mm · 9 of 102 slices shown (1 of 4)]
[im 1/102]
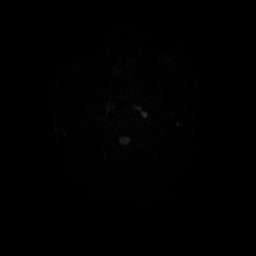
[im 13/102]
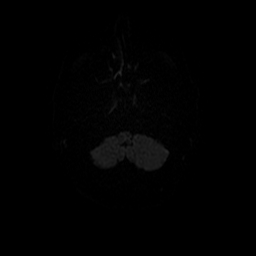
[im 26/102]
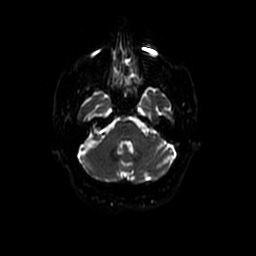
[im 38/102]
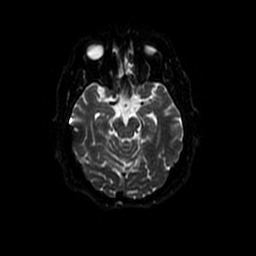
[im 51/102]
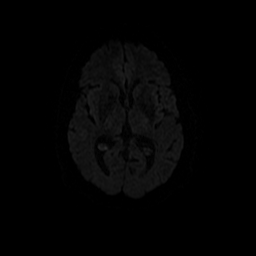
[im 64/102]
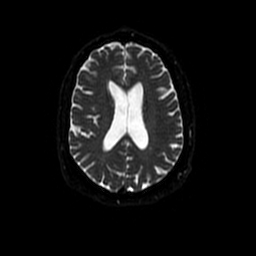
[im 76/102]
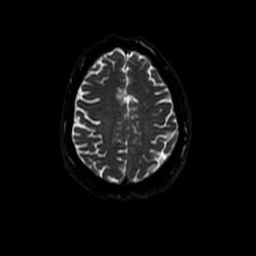
[im 89/102]
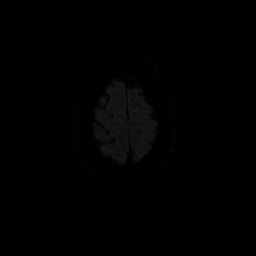
[im 102/102]
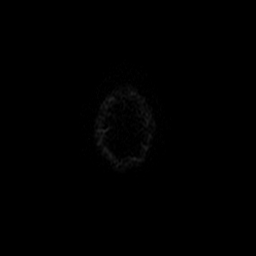

[Series 5: DWI · coronal · 5.0mm · 1.09mm/px · 6 of 72 slices shown (2 of 4)]
[im 1/72]
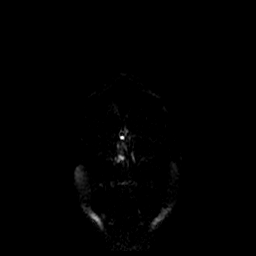
[im 15/72]
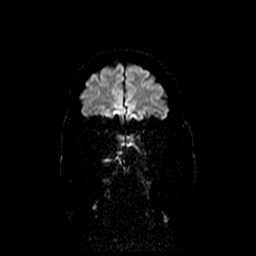
[im 29/72]
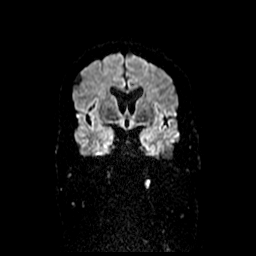
[im 43/72]
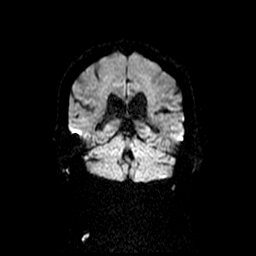
[im 57/72]
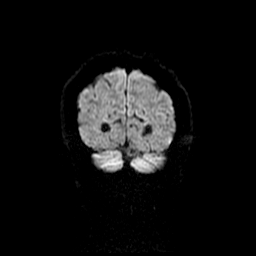
[im 72/72]
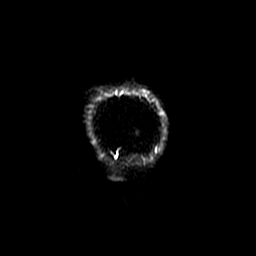

[Series 6: T2 · axial · 5.0mm · 0.43mm/px · z∈[-11,+131]mm · 2 of 25 slices shown]
[im 1/25]
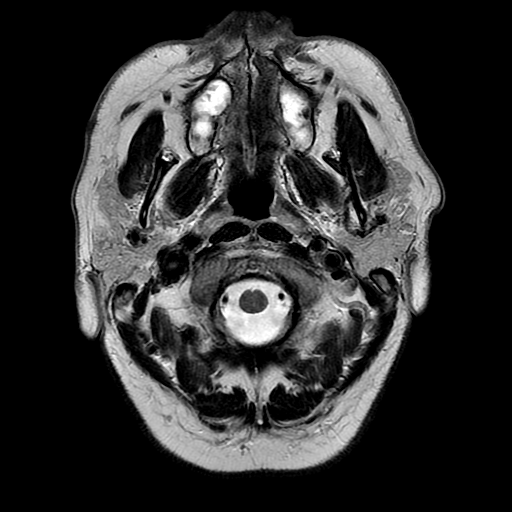
[im 25/25]
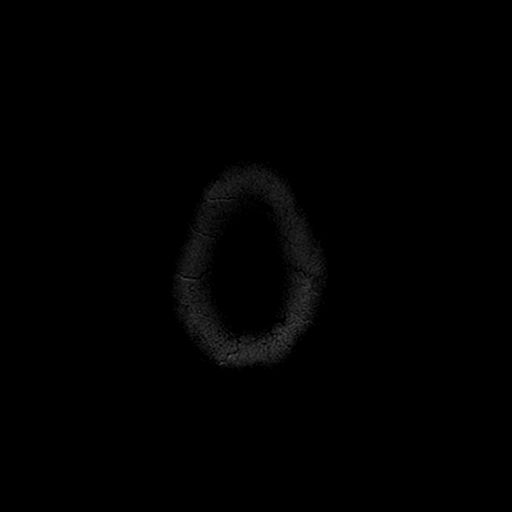

[Series 7: FLAIR · axial · 3.0mm · 0.43mm/px · z∈[-10,+132]mm · 2 of 25 slices shown]
[im 1/25]
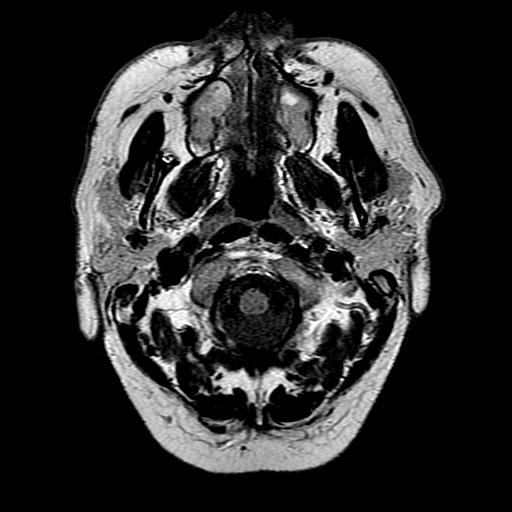
[im 25/25]
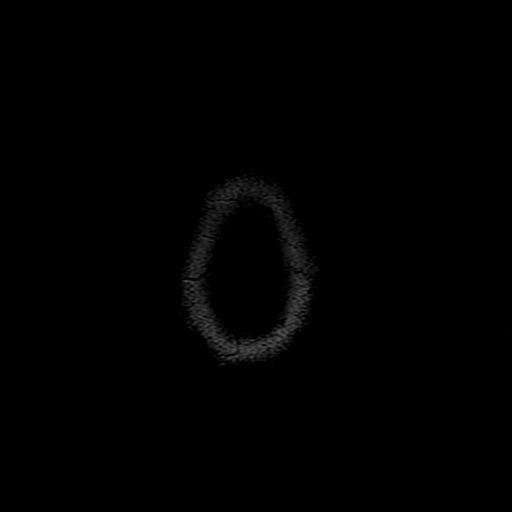

[Series 14: T2 post-contrast · coronal · 5.0mm · 0.39mm/px · 2 of 28 slices shown]
[im 1/28]
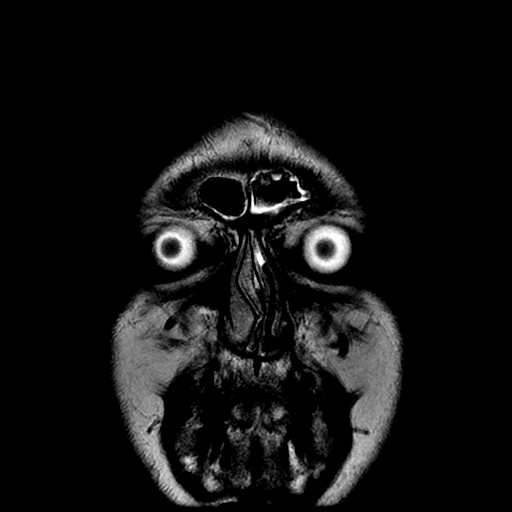
[im 28/28]
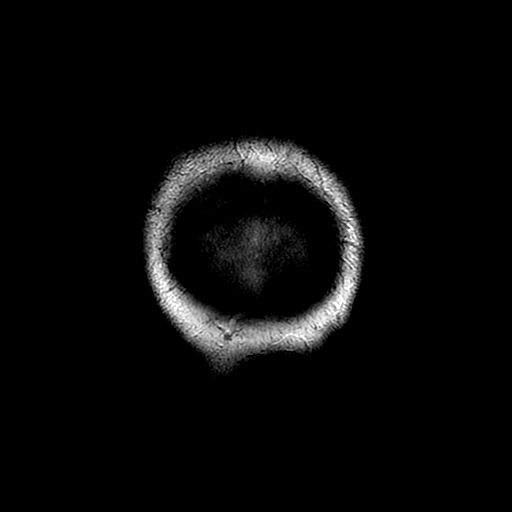

[Series 17: T1 post-contrast · coronal · 5.0mm · 0.43mm/px · 2 of 28 slices shown (1 of 2)]
[im 1/28]
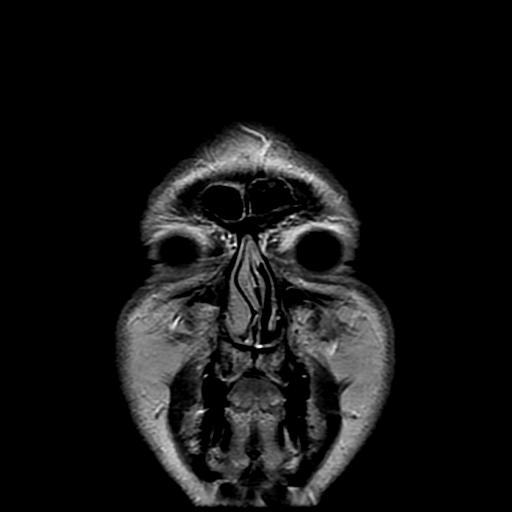
[im 28/28]
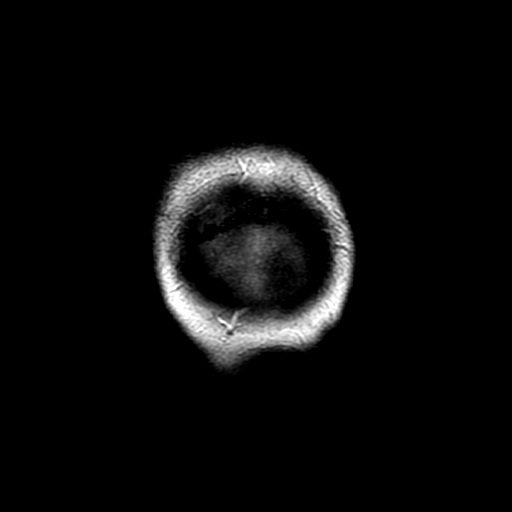

[Series 18: T1 post-contrast · axial · 3.0mm · 0.47mm/px · z∈[-13,+138]mm · 4 of 52 slices shown (2 of 2)]
[im 1/52]
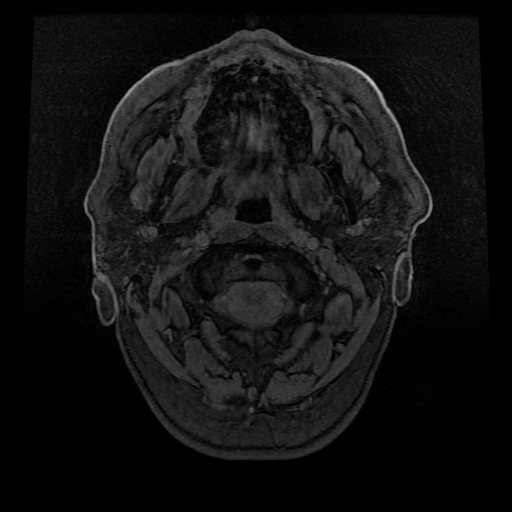
[im 18/52]
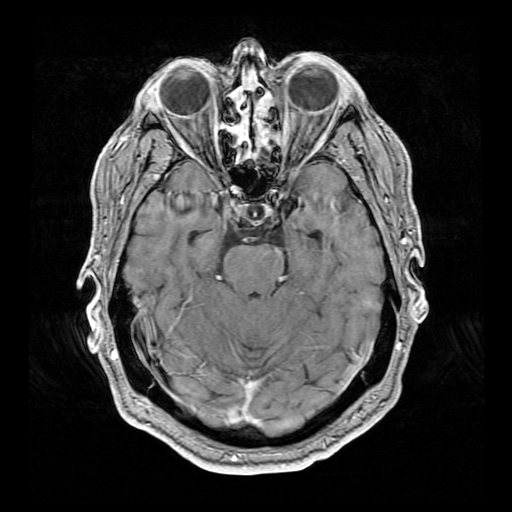
[im 35/52]
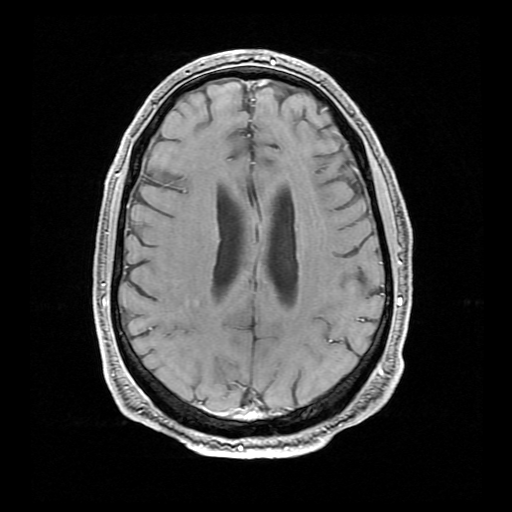
[im 52/52]
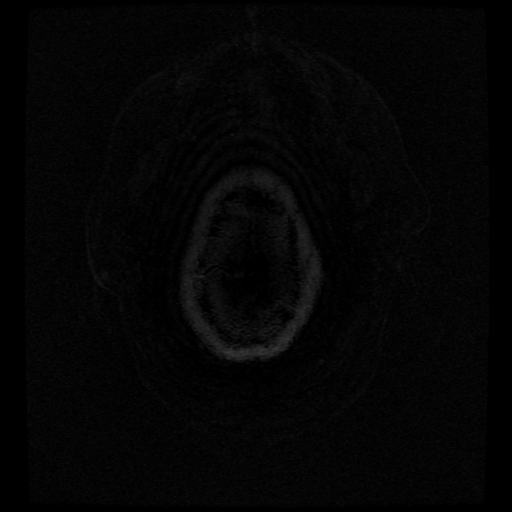

[Series 400: DWI · axial · 3.0mm · 1.09mm/px · z∈[-14,+134]mm · 4 of 51 slices shown (3 of 4)]
[im 1/51]
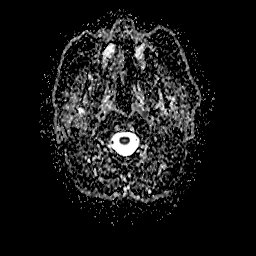
[im 17/51]
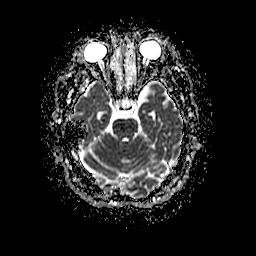
[im 34/51]
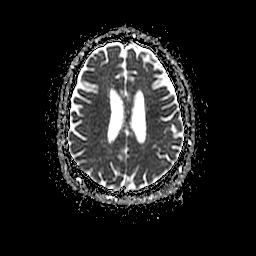
[im 51/51]
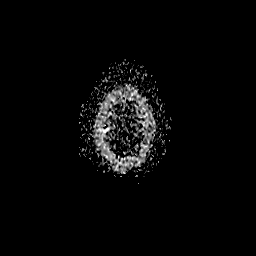

[Series 500: DWI · coronal · 5.0mm · 1.09mm/px · 3 of 36 slices shown (4 of 4)]
[im 1/36]
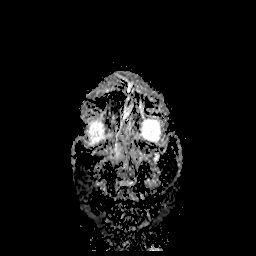
[im 18/36]
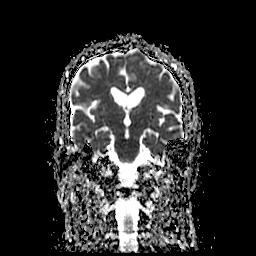
[im 36/36]
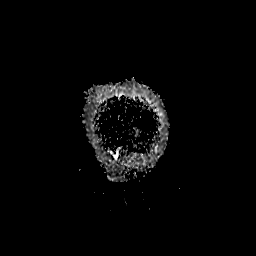

[34 of 48 positions shown; findings below may reference images not displayed]

FINDINGS: MRI HEAD FINDINGS

Brain: No acute infarction, hemorrhage, hydrocephalus, extra-axial
collection or mass lesion. Few small white matter hyperintensities
bilaterally.

Normal enhancement postcontrast administration.

Vascular: Normal arterial flow voids at the skull base.

Skull and upper cervical spine: No focal abnormality.

Other: None

MRI ORBITS FINDINGS

Orbits: Globes symmetric in size and configuration. Normal lens
location bilaterally. Optic nerve normal bilaterally. No optic nerve
mass or signal abnormality. Optic chiasm normal. Pituitary not
enlarged. Cavernous sinus normal bilaterally. Extraocular muscles
normal bilaterally. No orbital mass or edema. Normal enhancement of
the orbit.

Visualized sinuses: Mucosal edema throughout the paranasal sinuses.
Air-fluid levels in the maxillary sinus bilaterally. Mastoid clear
bilaterally.

Soft tissues: No soft tissue edema or mass.
IMPRESSION: 1. MRI head normal for age. Few small white matter hyperintensities
likely due to chronic microvascular ischemia.
2. Normal orbit bilaterally.  Optic nerve normal bilaterally.
3. Moderate mucosal edema throughout the paranasal sinuses.
Air-fluid level in the maxillary sinus bilaterally.

## 2021-11-14 MED ORDER — GADOBUTROL 1 MMOL/ML IV SOLN
7.0000 mL | Freq: Once | INTRAVENOUS | Status: AC | PRN
  Administered 2021-11-14: 8 mL via INTRAVENOUS

## 2022-08-28 HISTORY — PX: COLONOSCOPY: SHX174

## 2024-01-31 ENCOUNTER — Other Ambulatory Visit (HOSPITAL_BASED_OUTPATIENT_CLINIC_OR_DEPARTMENT_OTHER): Payer: Self-pay | Admitting: Family Medicine

## 2024-01-31 DIAGNOSIS — E7800 Pure hypercholesterolemia, unspecified: Secondary | ICD-10-CM

## 2024-02-22 ENCOUNTER — Ambulatory Visit (HOSPITAL_BASED_OUTPATIENT_CLINIC_OR_DEPARTMENT_OTHER)
Admission: RE | Admit: 2024-02-22 | Discharge: 2024-02-22 | Disposition: A | Discharge status: Home / Self Care | Payer: Self-pay | Source: Ambulatory Visit | Attending: Family Medicine | Admitting: Family Medicine

## 2024-02-22 DIAGNOSIS — E7800 Pure hypercholesterolemia, unspecified: Secondary | ICD-10-CM | POA: Insufficient documentation

## 2024-09-10 NOTE — Progress Notes (Signed)
 " Cardiology Office Note:  .   Date:  09/17/2024 ID:  Max Walsh, DOB Apr 19, 1961, MRN [id number] PCP: Stamey, Chiquita POUR, FNP [facility] Providers Cardiologist:  None   Patient Profile: .      PMH Right pulmonary nodule 5 mm Coronary artery disease CT Calcium score 02/26/24 CAC Score of 253 (73rd percentile) LM 0, LAD 81.4, LCx 135, RCA 36.5 Hypertension Hyperlipidemia       History of Present Illness: .   Discussed the use of AI scribe software for clinical note transcription with the patient, who gave verbal consent to proceed.  History of Present Illness Max Walsh is a very pleasant 64 year old who presents for evaluation of elevated calcium score. He is concerned about his cardiovascular health due to strong family history of heart disease, including a younger brother with quintuple bypass two years ago and a paternal grandfather who died of a heart attack; brother is 3 years younger. CT calcium score 01/2024 with calcification in three of four main coronary arteries with a total score of 253 (73rd percentile). He has shortness of breath at times with exertion. No chest pain, palpitations, orthopnea, PND, or presyncope or syncope. He has high cholesterol treated with rosuvastatin, which reduced LDL from 121 in October to 72 to December without side effects. History of hypertension treated with amlodipine and losartan with generally well-controlled BP. He does not regularly check BP at home. Recent diagnosis of prediabetes with A1c 5.9%. Admits to sedentary lifestyle, is at his heaviest weight, and does not exercise regularly. He tries to limit portions, cooks at home, and acknowledges alcohol use as an issue. He has never smoked. He notes occasional ankle swelling, especially when sitting with feet up, without significant leg swelling. He reports no known family history of valvular heart disease.  Family history: His family history includes Cancer in his father;  Diabetes in his brother; Heart attack in his paternal grandfather; Heart disease in his brother; Lung cancer in his father.  Brother had CABG, is 3 years younger Father died of lung cancer age 75  ASCVD Risk Score: ASCVD (Atherosclerotic Cardiovascular Disease) Risk Algorithm including Known ASCVD from AHA/ACC from [email or web address] on 09/17/2024 ** All calculations should be rechecked by clinician prior to use **  RESULT SUMMARY: 9.5 % Risk of cardiovascular event (coronary or stroke death or non-fatal MI or stroke) in next 10 years.  Moderate- to high-intensity statin recommended because 10-year risk >7.5%   ACC/AHA recommends initiating statins at this risk level, but the USPSTF recommends a trial of lifestyle intervention first - use clinician judgment and shared decision making in deciding whether or not to start a statin  To view statin dosages by intensity, see Evidence section.   INPUTS: History of ASCVD --> 0 = No LDL Cholesterol >=190mg /dL (5.07 mmol/L) --> 0 = No Age --> 63 years Diabetes --> 0 = No Sex --> 1 = Male Total Cholesterol --> 147 mg/dL HDL Cholesterol --> 62 mg/dL Systolic Blood Pressure --> 120 mm Hg Treatment for Hypertension --> 1 = Yes Smoker --> 0 = No Race --> 1 = White  Diet: Cooks at home Limits portion sizes to control caloric intake  Activity: Part-time in audiological scientist, previously worked [employer] as a Museum/gallery Conservator to sedentary lifestyle Golfing, usually rides  No results found for: LIPOA    ROS: See HPI       Studies Reviewed: SABRA   EKG Interpretation Date/Time:  Wednesday September 17 2024 09:56:04 EST Ventricular  Rate:  78 PR Interval:  166 QRS Duration:  82 QT Interval:  346 QTC Calculation: 394 R Axis:   38  Text Interpretation: Normal sinus rhythm Normal ECG When compared with ECG of 26-Jul-2019 19:34, PREVIOUS ECG IS PRESENT Confirmed by Percy Browning [phone]) on 09/17/2024 10:00:41 AM      Risk Assessment/Calculations:              Physical Exam:   VS: BP 122/78 (BP Location: Right Arm, Patient Position: Sitting, Cuff Size: Normal)   Pulse 78   Ht 5' 11.5 (1.816 m)   Wt 204 lb (92.5 kg)   SpO2 97%   BMI 28.06 kg/m   Wt Readings from Last 3 Encounters:  09/17/24 204 lb (92.5 kg)  07/26/19 176 lb (79.8 kg)     GEN: Well nourished, well developed in no acute distress NECK: No JVD; No carotid bruits CARDIAC: RRR, no murmurs, rubs, gallops RESPIRATORY:  Clear to auscultation without rales, wheezing or rhonchi  ABDOMEN: Soft, non-tender, non-distended EXTREMITIES:  No edema; No deformity     ASSESSMENT AND PLAN: .    Assessment & Plan Coronary artery disease  Cardiac risk   Elevated coronary artery calcium score of 253 (73rd percentile) with calcification in three coronary arteries, most notably in the left anterior descending artery. Family history of CAD with younger brother having recently undergone CABG x 5 and MI in paternal grandfather.  He reports shortness of breath with exertion at times and admits he is not very active.  No chest pain, palpitations.  EKG reveals normal sinus rhythm with no ST/T abnormality.  Due to advanced risk with elevated calcium score, family history, age, symptoms of DOE, and additional risk factors of hyperlipidemia and hypertension, we will proceed with further ischemia evaluation. - Coronary CTA for evaluation of ischemia and mapping of coronary anatomy - Lopressor  100 mg 2 hours prior to CT - Start aspirin  81 mg daily  - Be as physically active as possible every day and aim for at least 150 minutes of moderate intensity exercise each week - Heart healthy diet avoiding processed foods, saturated fat, sugar, and other simple carbohydrates encouraged  Hyperlipidemia LDL goal < 70  Lipid panel completed 08/15/2024 with total cholesterol 147, HDL 62, LDL 72, and triglycerides 66.  LDL improved from 121 mg/dL to 72 mg/dL with rosuvastatin 10 mg daily which she is tolerating  without concerning side effect.  We reviewed lipid goals with consideration of LDL goal 55 or lower based on family history.  Will await coronary CTA for further discussion regarding lipid-lowering therapy. - Continue rosuvastatin  - Heart healthy diet and regular exercise encouraged  Hypertension   BP is well-controlled with amlodipine and losartan.  Renal function stable on labs completed 08/15/2024.  No change in antihypertensive therapy today. - Continue amlodipine and losartan.  Prediabetes   His hemoglobin A1c is 5.9% on 08/26/24. We discussed dietary modifications and exercise to manage blood sugar and reduce cardiovascular risk. Recommend implementing dietary changes to focus on whole foods, limiting saturated fats to no more than 10% of the diet, and reducing alcohol intake. Encouraged at least 150 minutes of moderate-intensity exercise per week. - Management per PCP       Dispo: 3 months with me  Signed, Browning Percy, NP-C "

## 2024-09-17 ENCOUNTER — Ambulatory Visit (HOSPITAL_BASED_OUTPATIENT_CLINIC_OR_DEPARTMENT_OTHER): Payer: Self-pay | Admitting: Nurse Practitioner

## 2024-09-17 ENCOUNTER — Encounter (HOSPITAL_BASED_OUTPATIENT_CLINIC_OR_DEPARTMENT_OTHER): Payer: Self-pay

## 2024-09-17 VITALS — BP 122/78 | HR 78 | Ht 71.5 in | Wt 204.0 lb

## 2024-09-17 DIAGNOSIS — I10 Essential (primary) hypertension: Secondary | ICD-10-CM

## 2024-09-17 DIAGNOSIS — I2510 Atherosclerotic heart disease of native coronary artery without angina pectoris: Secondary | ICD-10-CM

## 2024-09-17 DIAGNOSIS — E7800 Pure hypercholesterolemia, unspecified: Secondary | ICD-10-CM

## 2024-09-17 DIAGNOSIS — Z7189 Other specified counseling: Secondary | ICD-10-CM | POA: Diagnosis not present

## 2024-09-17 DIAGNOSIS — R0609 Other forms of dyspnea: Secondary | ICD-10-CM

## 2024-09-17 DIAGNOSIS — E785 Hyperlipidemia, unspecified: Secondary | ICD-10-CM

## 2024-09-17 DIAGNOSIS — R931 Abnormal findings on diagnostic imaging of heart and coronary circulation: Secondary | ICD-10-CM

## 2024-09-17 LAB — BASIC METABOLIC PANEL WITH GFR
BUN/Creatinine Ratio: 16 (ref 10–24)
BUN: 16 mg/dL (ref 8–27)
CO2: 24 mmol/L (ref 20–29)
Calcium: 9.9 mg/dL (ref 8.6–10.2)
Chloride: 102 mmol/L (ref 96–106)
Creatinine, Ser: 0.98 mg/dL (ref 0.76–1.27)
Glucose: 106 mg/dL — ABNORMAL HIGH (ref 70–99)
Potassium: 4.4 mmol/L (ref 3.5–5.2)
Sodium: 142 mmol/L (ref 134–144)
eGFR: 86 mL/min/1.73

## 2024-09-17 MED ORDER — METOPROLOL TARTRATE 100 MG PO TABS: ORAL_TABLET | ORAL | 0 refills | Status: AC

## 2024-09-17 MED ORDER — METOPROLOL TARTRATE 100 MG PO TABS: ORAL_TABLET | ORAL | 0 refills | Status: DC

## 2024-09-17 MED ORDER — ASPIRIN 81 MG PO TBEC: 81.0000 mg | DELAYED_RELEASE_TABLET | Freq: Every day | ORAL | Status: AC

## 2024-09-17 NOTE — Patient Instructions (Addendum)
 Medication Instructions:  START ASPIRIN  81 MG DAILY   TAKE METOPROLOL  100 MG 2  HOURS PRIOR TO CARDIAC CT  *If you need a refill on your cardiac medications before your next appointment, please call your pharmacy*  Lab Work: BMET TODAY   If you have labs (blood work) drawn today and your tests are completely normal, you will receive your results only by: MyChart Message (if you have MyChart) OR A paper copy in the mail If you have any lab test that is abnormal or we need to change your treatment, we will call you to review the results.  Testing/Procedures: Your physician has requested that you have cardiac CT. Cardiac computed tomography (CT) is a painless test that uses an x-ray machine to take clear, detailed pictures of your heart. For further information please visit [email or web address] Please follow instruction sheet as given.  Follow-Up: At [facility], you and your health needs are our priority.  As part of our continuing mission to provide you with exceptional heart care, our providers are all part of one team.  This team includes your primary Cardiologist (physician) and Advanced Practice Providers or APPs (Physician Assistants and Nurse Practitioners) who all work together to provide you with the care you need, when you need it.  Your next appointment:   3 month(s)  Provider:   Rosaline Bane, NP    We recommend signing up for the patient portal called MyChart.  Sign up information is provided on this After Visit Summary.  MyChart is used to connect with patients for Virtual Visits (Telemedicine).  Patients are able to view lab/test results, encounter notes, upcoming appointments, etc.  Non-urgent messages can be sent to your provider as well.   To learn more about what you can do with MyChart, go to [email or web address].   Other Instructions   Your cardiac CT will be scheduled at one of the below locations:   [facility] [address] [place], [place] [address] [phone] (Severe contrast allergies only)  OR   [facility] [address] [place], [place] [address] [phone]  OR   [facility] [address] [place], [place] [address] [phone]  OR   [facility] [address]  [place], [place] [address]  OR   [facility] [address] [place], [place] [phone]  If scheduled at [facility], please arrive at the [facility] and Children's Entrance (Entrance C2) of [facility] 30 minutes prior to test start time. You can use the FREE valet parking offered at entrance C (encouraged to control the heart rate for the test)  Proceed to the [facility] Radiology Department (first floor) to check-in and test prep.  All radiology patients and guests should use entrance C2 at [facility], accessed from [facility], even though the hospital's physical address listed is [address]  If scheduled at the Heart and Vascular Tower at [address], please enter the parking lot using the [address] entrance and use the FREE valet service at the patient drop-off area. Enter the building and check-in with registration on the main floor.  If scheduled at [facility], please arrive to the Heart and Vascular Center 15 mins early for check-in and test prep.  There is spacious parking and easy access to the radiology department from the [facility] Heart and Vascular entrance. Please enter here and check-in with the desk  attendant.   If scheduled at [facility], please arrive 30 minutes early for check-in and test prep.  Please follow these instructions carefully (unless otherwise directed):  An IV will be required for this test and Nitroglycerin will be given.  Hold all erectile dysfunction medications at least 3 days (72 hrs) prior to test. (Ie viagra, cialis,  sildenafil, tadalafil, etc)   On the Night Before the Test: Be sure to Drink plenty of water. Do not consume any caffeinated/decaffeinated beverages or chocolate 12 hours prior to your test. Do not take any antihistamines 12 hours prior to your test.  On the Day of the Test: Drink plenty of water until 1 hour prior to the test. Do not eat any food 1 hour prior to test. You may take your regular medications prior to the test.  Take metoprolol  (Lopressor ) two hours prior to test. If you take Furosemide/Hydrochlorothiazide/Spironolactone/Chlorthalidone, please HOLD on the morning of the test. Patients who wear a continuous glucose monitor MUST remove the device prior to scanning.  After the Test: Drink plenty of water. After receiving IV contrast, you may experience a mild flushed feeling. This is normal. On occasion, you may experience a mild rash up to 24 hours after the test. This is not dangerous. If this occurs, you can take Benadryl 25 mg, Zyrtec, Claritin, or Allegra and increase your fluid intake. (Patients taking Tikosyn should avoid Benadryl, and may take Zyrtec, Claritin, or Allegra) If you experience trouble breathing, this can be serious. If it is severe call 911 IMMEDIATELY. If it is mild, please call our office.  We will call to schedule your test 2-4 weeks out understanding that some insurance companies will need an authorization prior to the service being performed.   For more information and frequently asked questions, please visit our website : [email or web address]  For non-scheduling related questions, please contact the cardiac imaging nurse navigator should you have any questions/concerns: Cardiac Imaging Nurse Navigators Direct Office Dial: [phone]   For scheduling needs, including cancellations and rescheduling, please call Brittany, [phone].  Cardiac CT Angiogram A cardiac CT angiogram is a procedure to look at the heart and the area around  the heart. It may be done to help find the cause of chest pains or other symptoms of heart disease. During this procedure, a substance called contrast dye is injected into a vein in the arm. The contrast highlights the blood vessels in the area to be checked. A large X-ray machine (CT scanner), then takes detailed pictures of the heart and the surrounding area. The procedure is also sometimes called a coronary CT angiogram, coronary artery scanning, or CTA. A cardiac CT angiogram allows the health care provider to see how well blood is flowing to and from the heart. The provider will be able to see if there are any problems, such as: Blockage or narrowing of the arteries in the heart. Fluid around the heart. Signs of weakness or disease in the muscles, valves, and tissues of the heart. Tell a health care provider about: Any allergies you have. This is especially important if you have had a previous allergic reaction to medicines, contrast dye, or iodine. All medicines you are taking, including vitamins, herbs, eye drops, creams, and over-the-counter medicines. Any bleeding problems you have. Any surgeries you have had. Any medical conditions you have, including kidney problems or kidney failure. Whether you are pregnant or may be pregnant. Any anxiety disorders, chronic pain, or other conditions you have. These may increase your  stress or prevent you from lying still. Any history of abnormal heart rhythms or heart procedures. What are the risks? Your provider will talk with you about risks. These may include: Bleeding. Infection. Allergic reactions to medicines or dyes. Damage to other structures or organs. Kidney damage from the contrast dye. Increased risk of cancer from radiation exposure. This risk is low. Talk with your provider about: The risks and benefits of testing. How you can receive the lowest dose of radiation. What happens before the procedure? Wear comfortable clothing and  remove any jewelry, glasses, dentures, and hearing aids. Follow instructions from your provider about eating and drinking. These may include: 12 hours before the procedure Avoid caffeine. This includes tea, coffee, soda, energy drinks, and diet pills. Drink plenty of water or other fluids that do not have caffeine in them. Being well hydrated can prevent complications. 4-6 hours before the procedure Stop eating and drinking. This will reduce the risk of nausea from the contrast dye. Ask your provider about changing or stopping your regular medicines. These include: Diabetes medicines. Medicines to treat problems with erections (erectile dysfunction). If you have kidney problems, you may need to receive IV hydration before and after the test. What happens during the procedure?  Hair on your chest may need to be removed so that small sticky patches called electrodes can be placed on your chest. These will transmit information that helps to monitor your heart during the procedure. An IV will be inserted into one of your veins. You might be given a medicine to control your heart rate during the procedure. This will help to ensure that good images are obtained. You will be asked to lie on an exam table. This table will slide in and out of the CT machine during the procedure. Contrast dye will be injected into the IV. You might feel warm, or you may get a metallic taste in your mouth. You may be given medicines to relax or dilate the arteries in your heart. If you are allergic to contrast dyes or iodine you may be given medicine before the test to reduce the risk of an allergic reaction. The table that you are lying on will move into the CT machine tunnel for the scan. The person running the machine will give you instructions while the scans are being done. You may be asked to: Keep your arms above your head. Hold your breath for short periods. Stay very still, even if the table is moving. The  procedure may vary among providers and hospitals. What can I expect after the procedure? After your procedure, it is common to have: A metallic taste in your mouth from the contrast dye. A feeling of warmth. A headache from the heart medicine. Follow these instructions at home: Take over-the-counter and prescription medicines only as told by your provider. If you are told, drink enough fluid to keep your pee pale yellow. This will help to flush the contrast dye out of your body. Most people can return to their normal activities right after the procedure. Ask your provider what activities are safe for you. It is up to you to get the results of your procedure. Ask your provider, or the department that is doing the procedure, when your results will be ready. Contact a health care provider if: You have any symptoms of allergy to the contrast dye. These include: Shortness of breath. Rash or hives. A racing heartbeat. You notice a change in your peeing (urination). This information is not  intended to replace advice given to you by your health care provider. Make sure you discuss any questions you have with your health care provider. Document Revised: 03/17/2022 Document Reviewed: 03/17/2022 Elsevier Patient Education  2024 Elsevier Inc.     Adopting a Healthy Lifestyle.   Weight: Know what a healthy weight is for you (roughly BMI <25) and aim to maintain this. You can calculate your body mass index on your smart phone. Unfortunately, this is not the most accurate measure of healthy weight, but it is the simplest measurement to use. A more accurate measurement involves body scanning which measures lean muscle, fat tissue and bony density. We do not have this equipment at [facility].    Diet: Aim for 7+ servings of fruits and vegetables daily Limit animal fats in diet for cholesterol and heart health - choose grass fed whenever available Avoid highly processed foods (fast food burgers, tacos,  fried chicken, pizza, hot dogs, french fries)  Saturated fat comes in the form of butter, lard, coconut oil, margarine, partially hydrogenated oils, dairy products, and fat in meat. These increase your risk of cardiovascular disease.  Use healthy plant oils, such as olive, canola, soy, corn, sunflower and peanut.  Whole foods such as fruits, vegetables and whole grains have fiber  Men need > 38 grams of fiber per day Women need > 25 grams of fiber per day  Load up on vegetables and fruits - one-half of your plate: Aim for color and variety, and remember that potatoes dont count. Go for whole grains - one-quarter of your plate: Whole wheat, barley, wheat berries, quinoa, oats, brown rice, and foods made with them. If you want pasta, go with whole wheat pasta. Protein power - one-quarter of your plate: Fish, chicken, beans, and nuts are all healthy, versatile protein sources. Limit red meat. You need carbohydrates for energy! The type of carbohydrate is more important than the amount. Choose carbohydrates such as vegetables, fruits, whole grains, beans, and nuts in the place of white rice, white pasta, potatoes (baked or fried), macaroni and cheese, cakes, cookies, and donuts.  If youre thirsty, drink water. Coffee and tea are good in moderation, but skip sugary drinks and limit milk and dairy products to one or two daily servings. Keep sugar intake at 6 teaspoons or 24 grams or LESS       Exercise: Aim for 150 min of moderate intensity exercise weekly for heart health, and weights twice weekly for bone health Stay active - any steps are better than no steps! Aim for 7-9 hours of sleep daily   Sleep: This provides your body with the reset and relaxation that it needs!  Aim to get 7-8 hours of sleep each night. Limit caffeine, screen time, and other distractions prior to bedtime.  Keep your bedroom cool and dark and do not wear heavy clothing to bed or use heavy bed covers - layer if needed.

## 2024-09-18 ENCOUNTER — Ambulatory Visit (HOSPITAL_BASED_OUTPATIENT_CLINIC_OR_DEPARTMENT_OTHER): Payer: Self-pay | Admitting: Nurse Practitioner

## 2024-12-25 ENCOUNTER — Ambulatory Visit (HOSPITAL_BASED_OUTPATIENT_CLINIC_OR_DEPARTMENT_OTHER): Admitting: Nurse Practitioner
